# Patient Record
Sex: Male | Born: 1975 | Race: White | Hispanic: No | Marital: Single | Smoking: Heavy tobacco smoker
Health system: Southern US, Community
[De-identification: ages and names within clinical notes are randomized; demographics above are authoritative.]

---

## 2007-09-01 ENCOUNTER — Emergency Department: Payer: Self-pay | Admitting: Unknown Physician Specialty

## 2013-07-06 ENCOUNTER — Emergency Department: Payer: Self-pay | Admitting: Emergency Medicine

## 2014-09-25 ENCOUNTER — Encounter: Payer: Self-pay | Admitting: Emergency Medicine

## 2014-09-25 ENCOUNTER — Emergency Department
Admission: EM | Admit: 2014-09-25 | Discharge: 2014-09-25 | Disposition: A | Discharge status: Home / Self Care | Payer: Self-pay | Attending: Emergency Medicine | Admitting: Emergency Medicine

## 2014-09-25 ENCOUNTER — Other Ambulatory Visit: Payer: Self-pay

## 2014-09-25 DIAGNOSIS — Y9389 Activity, other specified: Secondary | ICD-10-CM | POA: Insufficient documentation

## 2014-09-25 DIAGNOSIS — T40601A Poisoning by unspecified narcotics, accidental (unintentional), initial encounter: Secondary | ICD-10-CM

## 2014-09-25 DIAGNOSIS — Z720 Tobacco use: Secondary | ICD-10-CM | POA: Insufficient documentation

## 2014-09-25 DIAGNOSIS — Y998 Other external cause status: Secondary | ICD-10-CM | POA: Insufficient documentation

## 2014-09-25 DIAGNOSIS — Y92009 Unspecified place in unspecified non-institutional (private) residence as the place of occurrence of the external cause: Secondary | ICD-10-CM | POA: Insufficient documentation

## 2014-09-25 DIAGNOSIS — T402X1A Poisoning by other opioids, accidental (unintentional), initial encounter: Secondary | ICD-10-CM | POA: Insufficient documentation

## 2014-09-25 LAB — BASIC METABOLIC PANEL
Anion gap: 5 (ref 5–15)
BUN: 24 mg/dL — AB (ref 6–20)
CO2: 27 mmol/L (ref 22–32)
CREATININE: 1.25 mg/dL — AB (ref 0.61–1.24)
Calcium: 8.7 mg/dL — ABNORMAL LOW (ref 8.9–10.3)
Chloride: 109 mmol/L (ref 101–111)
GFR calc Af Amer: >60 mL/min (ref >60)
Glucose, Bld: 160 mg/dL — ABNORMAL HIGH (ref 65–99)
Potassium: 3.5 mmol/L (ref 3.5–5.1)
Sodium: 141 mmol/L (ref 135–145)

## 2014-09-25 LAB — CBC WITH DIFFERENTIAL/PLATELET
BASOS ABS: 0 10*3/uL (ref 0–0.1)
Basophils Relative: 1 %
EOS PCT: 5 %
Eosinophils Absolute: 0.4 10*3/uL (ref 0–0.7)
HEMATOCRIT: 42.4 % (ref 40.0–52.0)
Hemoglobin: 14.4 g/dL (ref 13.0–18.0)
Lymphocytes Relative: 36 %
Lymphs Abs: 2.9 10*3/uL (ref 1.0–3.6)
MCH: 30.6 pg (ref 26.0–34.0)
MCHC: 34 g/dL (ref 32.0–36.0)
MCV: 90 fL (ref 80.0–100.0)
MONOS PCT: 11 %
Monocytes Absolute: 0.9 10*3/uL (ref 0.2–1.0)
Neutro Abs: 3.8 10*3/uL (ref 1.4–6.5)
Neutrophils Relative %: 47 %
Platelets: 266 10*3/uL (ref 150–440)
RBC: 4.71 MIL/uL (ref 4.40–5.90)
RDW: 12.9 % (ref 11.5–14.5)
WBC: 8 10*3/uL (ref 3.8–10.6)

## 2014-09-25 LAB — SALICYLATE LEVEL: Salicylate Lvl: <4 mg/dL (ref 2.8–30.0)

## 2014-09-25 LAB — ACETAMINOPHEN LEVEL: Acetaminophen (Tylenol), Serum: <10 ug/mL — ABNORMAL LOW (ref 10–30)

## 2014-09-25 MED ORDER — NALOXONE HCL 0.4 MG/0.4ML IJ SOAJ: 0.4000 mg | Freq: Once | INTRAMUSCULAR | Status: AC

## 2014-09-25 NOTE — ED Provider Notes (Signed)
[facility] Emergency Department Provider Note   ____________________________________________  Time seen: On EMS arrival  I have reviewed the triage vital signs and the nursing notes.   HISTORY  Chief Complaint Drug Overdose   History limited by: Not Limited   HPI Vincent Owens is a 39 y.o. male who presents to the emergency department via EMS after a heroin overdose. EMS was called to the patient's friend's house after they found him unresponsive with agonal breathing. When EMS arrived he was slightly cyanotic with agonal breathing. They did give intranasal Narcan with good response. Patient admits to doing a bag of heroin. He states he hasn't done here when in roughly 7 months since he was incarcerated. He denies any other drug ingestion or use. He denies any medical problems.     History reviewed. No pertinent past medical history.  There are no active problems to display for this patient.   History reviewed. No pertinent past surgical history.  No current outpatient prescriptions on file.  Allergies Review of patient's allergies indicates no known allergies.  History reviewed. No pertinent family history.  Social History Social History  Substance Use Topics  . Smoking status: Heavy Tobacco Smoker -- 0.50 packs/day  . Smokeless tobacco: None  . Alcohol Use: Yes    Review of Systems  Constitutional: Negative for fever. Cardiovascular: Negative for chest pain. Respiratory: Negative for shortness of breath. Gastrointestinal: Negative for abdominal pain, vomiting and diarrhea. Genitourinary: Negative for dysuria. Musculoskeletal: Negative for back pain. Skin: Negative for rash. Neurological: Negative for headaches, focal weakness or numbness.   10-point ROS otherwise negative.  ____________________________________________   PHYSICAL EXAM:  VITAL SIGNS: ED Triage Vitals  Enc Vitals Group     BP 09/25/14 1948 128/87 mmHg    Pulse Rate 09/25/14 1948 75     Resp --      Temp 09/25/14 1948 98 F (36.7 C)     Temp src --      SpO2 09/25/14 1948 97 %     Weight 09/25/14 1948 160 lb (72.576 kg)     Height 09/25/14 1948 5\' 6"  (1.676 m)   Constitutional: Alert and oriented. Well appearing and in no distress. Eyes: Conjunctivae are normal. PERRL. Normal extraocular movements. ENT   Head: Normocephalic and atraumatic.   Nose: No congestion/rhinnorhea.   Mouth/Throat: Mucous membranes are moist.   Neck: No stridor. Hematological/Lymphatic/Immunilogical: No cervical lymphadenopathy. Cardiovascular: Normal rate, regular rhythm.  No murmurs, rubs, or gallops. Respiratory: Normal respiratory effort without tachypnea nor retractions. Breath sounds are clear and equal bilaterally. No wheezes/rales/rhonchi. Gastrointestinal: Soft and nontender. No distention.  Genitourinary: Deferred Musculoskeletal: Normal range of motion in all extremities. No joint effusions.  No lower extremity tenderness nor edema. Neurologic:  Normal speech and language. No gross focal neurologic deficits are appreciated. Speech is normal.  Skin:  Skin is warm, dry and intact. No rash noted. Psychiatric: Mood and affect are normal. Speech and behavior are normal. Patient exhibits appropriate insight and judgment.  ____________________________________________    LABS (pertinent positives/negatives)  Labs Reviewed  BASIC METABOLIC PANEL - Abnormal; Notable for the following:    Glucose, Bld 160 (*)    BUN 24 (*)    Creatinine, Ser 1.25 (*)    Calcium 8.7 (*)    All other components within normal limits  ACETAMINOPHEN LEVEL - Abnormal; Notable for the following:    Acetaminophen (Tylenol), Serum <10 (*)    All other components within normal limits  CBC  WITH DIFFERENTIAL/PLATELET  SALICYLATE LEVEL     ____________________________________________   EKG  I, Phineas Semen, attending physician, personally viewed and  interpreted this EKG  EKG Time: 1955 Rate: 64 Rhythm: normal sinus rhythm Axis: normal Intervals: normal QRS: normal ST changes: no st elevation    ____________________________________________    RADIOLOGY  None  ____________________________________________   PROCEDURES  Procedure(s) performed: None  Critical Care performed: No  ____________________________________________   INITIAL IMPRESSION / ASSESSMENT AND PLAN / ED COURSE  Pertinent labs & imaging results that were available during my care of the patient were reviewed by me and considered in my medical decision making (see chart for details).  Patient presented to the emergency department today via EMS after a heroin overdose. Does not appear that there is any intent for self-harm. Patient awake and alert here. We will plan on observing for a number of hours to make sure the patient is doing well after Narcan is warm and off. Additionally will simply check basic blood work and salicylate and Tylenol levels.  ----------------------------------------- 11:41 PM on 09/25/2014 -----------------------------------------  Patient was watched in the emergency department for a number of hours. At the time of discharge patient was awake, alert without any signs of opiate overdose. I did discuss with the patient the importance of stopping heroin use. I will give patient prescription for Narcan autoinjector. I did instruct patient that if he already friend requires its use patient still immediately call 911.  ____________________________________________   FINAL CLINICAL IMPRESSION(S) / ED DIAGNOSES  Final diagnoses:  Opiate overdose, accidental or unintentional, initial encounter     Phineas Semen, MD 09/25/14 2342

## 2014-09-25 NOTE — Discharge Instructions (Signed)
Please seek medical attention for any high fevers, chest pain, shortness of breath, change in behavior, persistent vomiting, bloody stool or any other new or concerning symptoms. °Narcotic Overdose °A narcotic overdose is the misuse or overuse of a narcotic drug. A narcotic overdose can make you pass out and stop breathing. If you are not treated right away, this can cause permanent brain damage or stop your heart. Medicine may be given to reverse the effects of an overdose. If so, this medicine may bring on withdrawal symptoms. The symptoms may be abdominal cramps, throwing up (vomiting), sweating, chills, and nervousness. °Injecting narcotics can cause more problems than just an overdose. AIDS, hepatitis, and other very serious infections are transmitted by sharing needles and syringes. If you decide to quit using, there are medicines which can help you through the withdrawal period. Trying to quit all at once on your own can be uncomfortable, but not life-threatening. Call your caregiver, Narcotics Anonymous, or any drug and alcohol treatment program for further help.  °Document Released: 03/10/2004 Document Revised: 04/25/2011 Document Reviewed: 01/02/2009 °[employer]® Patient Information ©2015 [employer]. This information is not intended to replace advice given to you by your health care provider. Make sure you discuss any questions you have with your health care provider. ° °

## 2014-09-25 NOTE — ED Notes (Signed)
Pt went to friend's house and they heard him fall and hit floor and did chest compressions. Agonal resp on ems arrival = they gave 2 mg narcan nasally and revived him.

## 2015-12-10 IMAGING — CR DG FOOT COMPLETE 3+V*L*
1 series · 3 of 3 positions shown · non-contrast
Comparison: None.

CLINICAL DATA: Soft tissues swelling medial foot

EXAM:
LEFT FOOT - COMPLETE 3+ VIEW

[Series 1: x foot ap left · 0.14mm/px · 3 of 3 slices shown]
[im 1/3]
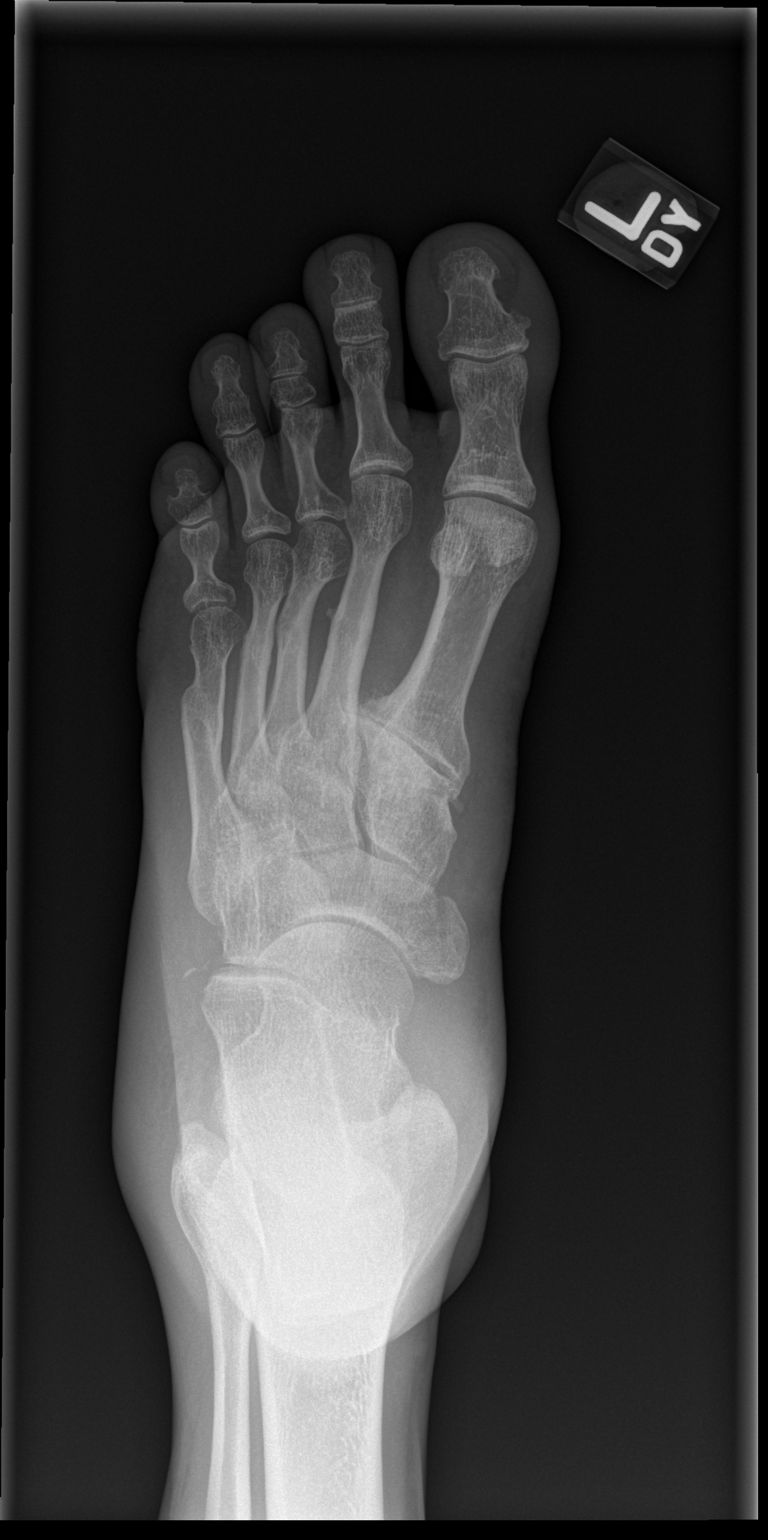
[im 2/3]
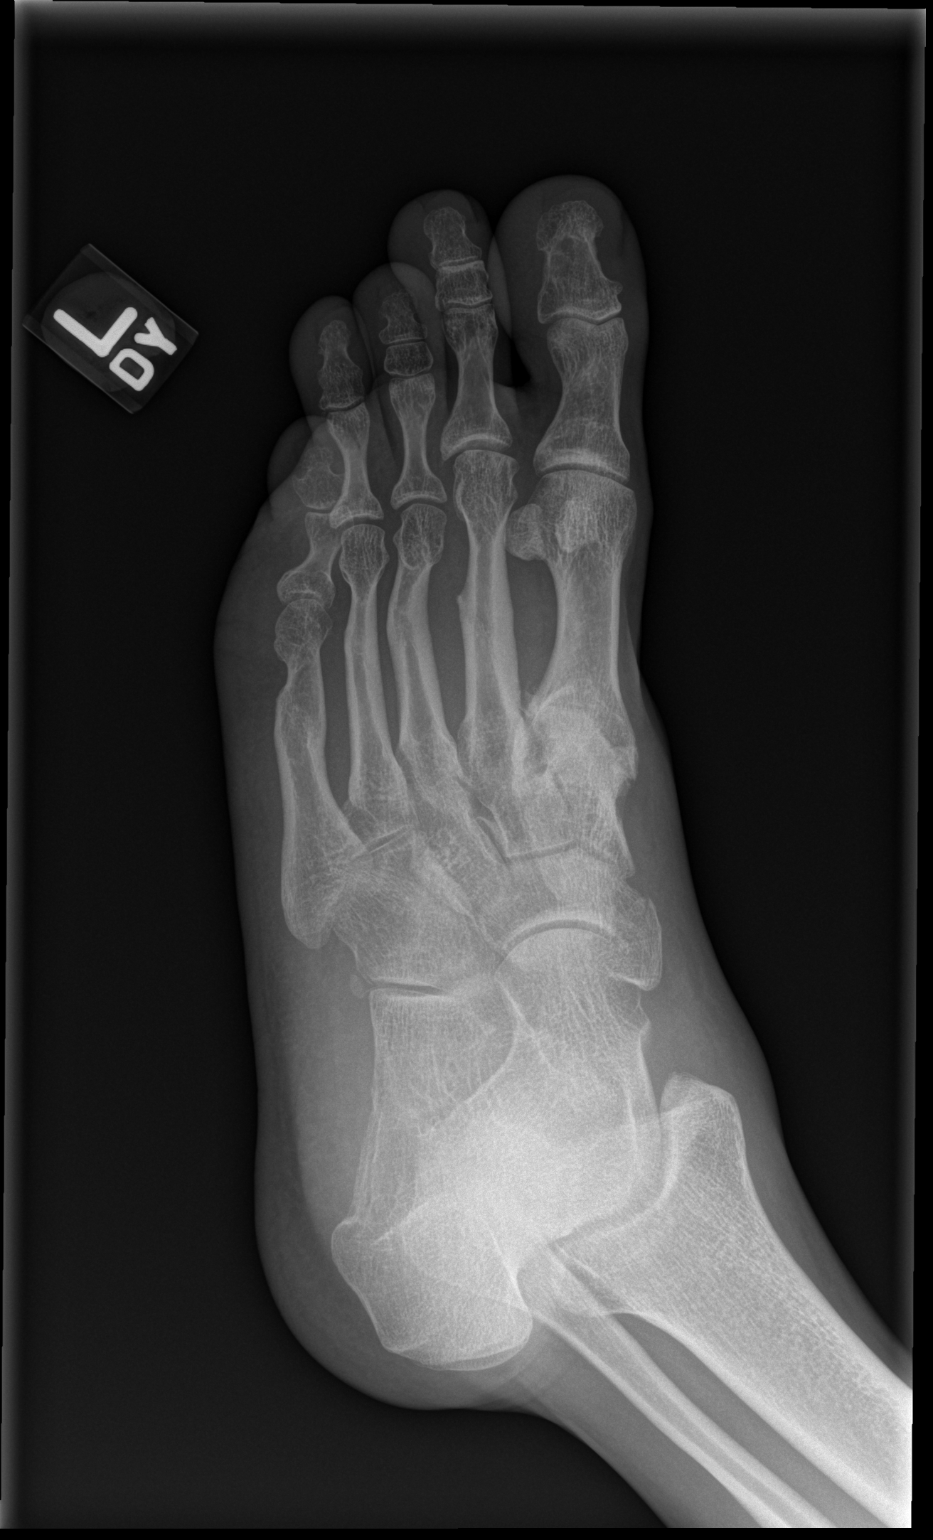
[im 3/3]
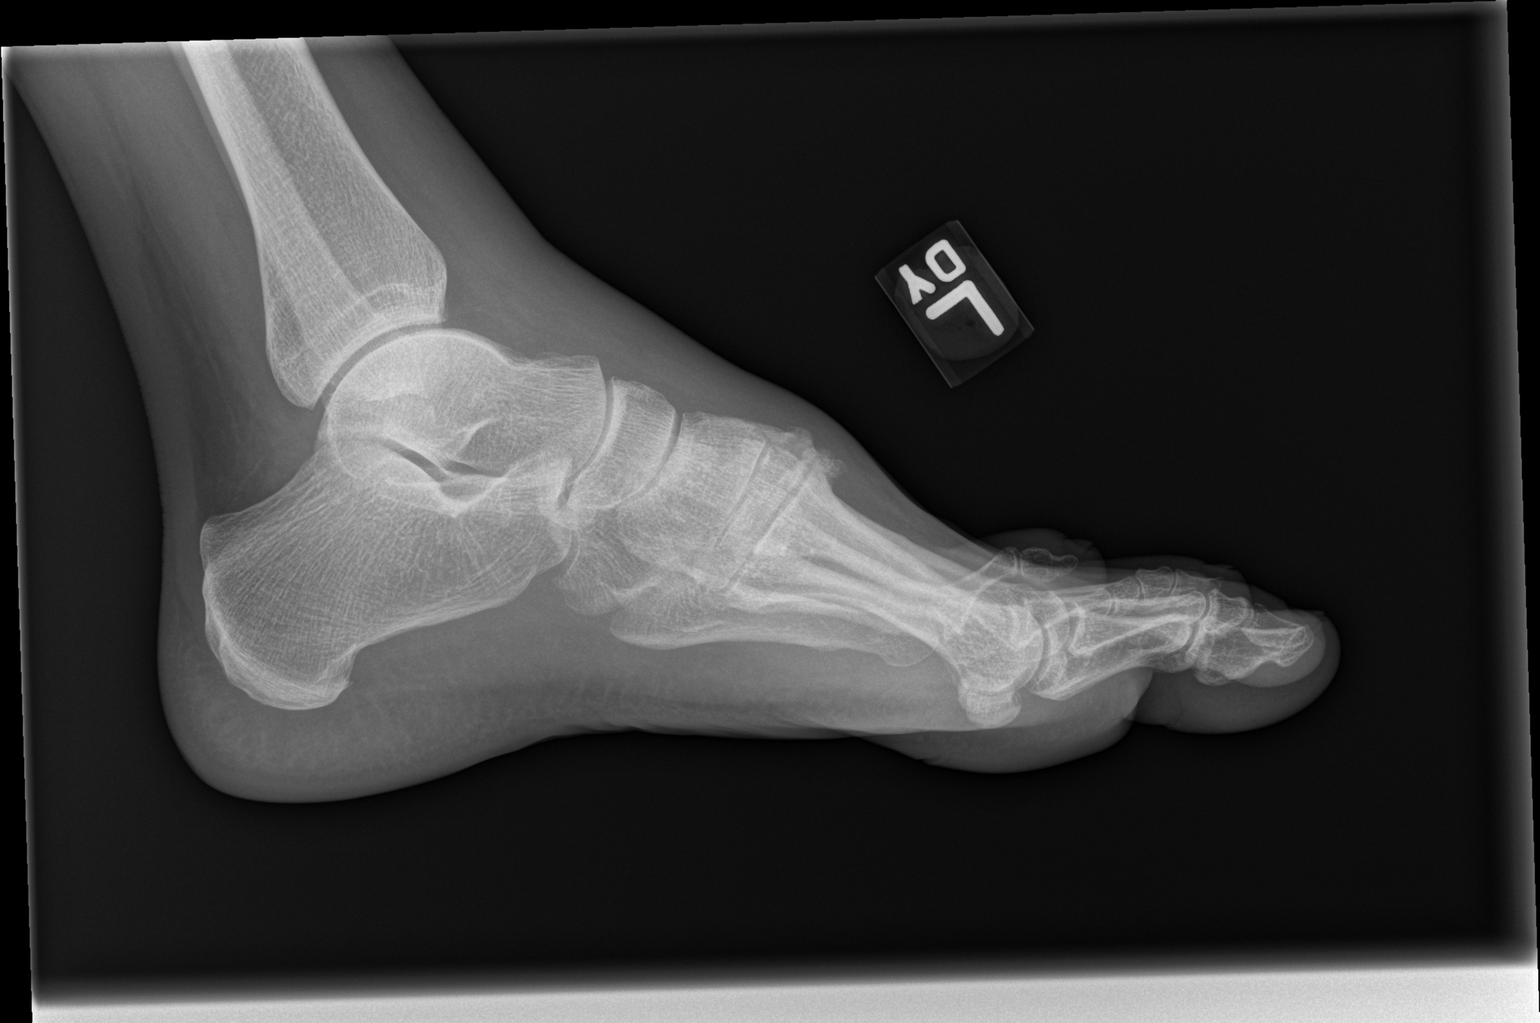

[3 of 3 positions shown; findings below may reference images not displayed]

FINDINGS: Three views of the left foot submitted. There is old fracture
deformity fifth metatarsal. Probable prior fractures first and
second metatarsal. Osteoarthritic changes noted first tarsal
metatarsal joint. Significant dorsal spurring first tarsal
metatarsal joint. Tiny plantar spur of calcaneus. On oblique view
there is cortical irregularity and subtle lucency medial aspect of
navicular. Subtle stress fracture cannot be excluded. Clinical
correlation is necessary.
IMPRESSION: There is old fracture deformity fifth metatarsal. Probable prior
fractures first and second metatarsal. Osteoarthritic changes noted
first tarsal metatarsal joint. Significant dorsal spurring first
tarsal metatarsal joint. Tiny plantar spur of calcaneus. On oblique
view there is cortical irregularity and subtle lucency medial aspect
of navicular. Subtle stress fracture cannot be excluded. Clinical
correlation is necessary.

## 2018-11-26 ENCOUNTER — Other Ambulatory Visit: Payer: Self-pay

## 2018-11-26 DIAGNOSIS — Z20822 Contact with and (suspected) exposure to covid-19: Secondary | ICD-10-CM

## 2018-11-28 LAB — NOVEL CORONAVIRUS, NAA: SARS-CoV-2, NAA: NOT DETECTED
